# Patient Record
Sex: Male | Born: 2000 | Race: White | Hispanic: No | Marital: Single | Smoking: Never smoker
Health system: Southern US, Community
[De-identification: ages and names within clinical notes are randomized; demographics above are authoritative.]

---
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
## 2023-09-18 ENCOUNTER — Ambulatory Visit: Admitting: Family Medicine

## 2023-09-18 DIAGNOSIS — Z113 Encounter for screening for infections with a predominantly sexual mode of transmission: Secondary | ICD-10-CM

## 2023-09-18 NOTE — Progress Notes (Signed)
 [facility] Department STI clinic [address], [address] [place] [address] Main phone: [phone]  STI screening visit  Subjective:  Andrew Riley is a 23 y.o. male being seen today for an STI screening visit. The patient reports they do not have symptoms.    Patient has the following medical conditions:  There are no active problems to display for this patient.  Chief Complaint  Patient presents with   SEXUALLY TRANSMITTED DISEASE    HPI Patient reports desire for STI screening, no symptoms.  See flowsheet for further details and programmatic requirements  Hyperlink available at the top of the signed note in blue.  Flow sheet content below:  Pregnancy Intention Screening Does the patient want to become pregnant in the next year?: No Does the patient's partner want to become pregnant in the next year?: No Would the patient like to discuss contraceptive options today?: N/A All Patients Anyone smoke around pt and/or pt's children?: No Anyone smoke inside pt's house?: No Anyone smoke inside car?: No Anyone smoke inside the workplace?: No Reason For STD Screen STD Screening: Is asymptomatic Have you ever had an STD?: Yes History of Antibiotic use in the past 2 weeks?: No STD Symptoms Denies all: Yes Risk Factors for Hep B Household, sexual, or needle sharing contact of a person infected with Hep B: No Sexual contact with a person who uses drugs not as prescribed?: No Currently or Ever used drugs not as prescribed: Yes HIV Positive: No PRep Patient: No Men who have sex with men: No Have Hepatitis C: No History of Incarceration: No History of Homeslessness?: No Anal sex following anal drug use?: No Risk Factors for Hep C Currently using drugs not as prescribed: No Sexual partner(s) currently using drugs as not prescribed: No History of drug use: Yes (MJ) HIV Positive: No People with a history of incarceration: No People born between  the years of 35 and 39: No Hepatitis Counseling Hep B Counseling: Counseled patient about increased risk of Hep B and recommendation for testing, Patient declines testing for Hep B today Hep C Counseling: Counseled patient about increased risk of Hep C and recommendation for testing, Patient declines testing for Hep C today Advise Advised client to quit or stay quit. : Yes Abuse History Has patient ever been abused physically?: No Has patient ever been abused sexually?: No Does patient feel they have a problem with Anxiety?: No Does patient feel they have a problem with Depression?: No Counseling Patient counseled to use condoms with all sex: Condoms declined RTC in 2-3 weeks for test results: Yes Clinic will call if test results abnormal before test result appt.: Yes Test results given to patient Patient counseled to use condoms with all sex: Condoms declined  Screening for MPX risk: Does the patient have an unexplained rash? No Is the patient MSM? No Does the patient endorse multiple sex partners or anonymous sex partners? No Did the patient have close or sexual contact with a person diagnosed with MPX? No Has the patient traveled outside the [place]  where MPX is endemic? No Is there a high clinical suspicion for MPX-- evidenced by one of the following No  -Unlikely to be chickenpox  -Lymphadenopathy  -Rash that present in same phase of evolution on any given body part  STI screening history: Last HIV test per patient/review of record was No results found for: HMHIVSCREEN No results found for: HIV  Last HEPC test per patient/review of record was No results found  for: HMHEPCSCREEN No components found for: HEPC   Last HEPB test per patient/review of record was No components found for: HMHEPBSCREEN   Fertility: Does the patient or their partner desires a pregnancy in the next year? No  Immunization History  Administered Date(s) Administered   PFIZER Comirnaty(Gray  Top)Covid-19 Tri-Sucrose Vaccine 07/08/2019, 07/31/2019    The following portions of the patient's history were reviewed and updated as appropriate: allergies, current medications, past medical history, past social history, past surgical history and problem list.  Objective:  There were no vitals filed for this visit.  Physical Exam Vitals and nursing note reviewed.  Constitutional:      Appearance: Normal appearance.  HENT:     Head: Normocephalic and atraumatic.     Mouth/Throat:     Mouth: Mucous membranes are moist.     Pharynx: No oropharyngeal exudate or posterior oropharyngeal erythema.   Eyes:     General:        Right eye: No discharge.        Left eye: No discharge.     Conjunctiva/sclera:     Right eye: Right conjunctiva is not injected. No exudate.    Left eye: Left conjunctiva is not injected. No exudate.  Pulmonary:     Effort: Pulmonary effort is normal.  Abdominal:     General: Abdomen is flat.     Palpations: Abdomen is soft. There is no hepatomegaly or mass.     Tenderness: There is no abdominal tenderness. There is no rebound.  Genitourinary:    Comments: Declined genital exam- asymptomatic Lymphadenopathy:     Cervical: No cervical adenopathy.     Upper Body:     Right upper body: No supraclavicular or axillary adenopathy.     Left upper body: No supraclavicular or axillary adenopathy.   Skin:    General: Skin is warm and dry.   Neurological:     Mental Status: He is alert and oriented to person, place, and time.      Assessment and Plan:  Andrew Riley is a 23 y.o. male presenting to the [facility] Department for STI screening  1. Screening for venereal disease (Primary) - Chlamydia/GC NAA, Confirmation - Gonococcus culture   Patient does not have STI symptoms Patient accepted the following screenings: oral GC culture and urine CT/GC Patient meets criteria for HepB screening? Yes. Ordered? No, declined Patient meets  criteria for HepC screening? Yes. Ordered? No, declined Recommended condom use with all sex Discussed importance of condom use for STI prevention  Treat positive test results per standing order. Discussed time line for [facility] results and that patient will be called with positive results and encouraged patient to call if he had not heard in 2 weeks Recommended repeat testing in 3 months with positive results. Recommended returning for continued or worsening symptoms.   No follow-ups on file.  No future appointments.  Verneta Bers, [place]

## 2023-09-18 NOTE — Progress Notes (Signed)
 Pt is here for STD screening. Condoms declined. Sonda Primes, RN.

## 2023-09-21 LAB — CHLAMYDIA/GC NAA, CONFIRMATION
Chlamydia trachomatis, NAA: NEGATIVE
Neisseria gonorrhoeae, NAA: NEGATIVE

## 2023-09-23 LAB — GONOCOCCUS CULTURE
